# Patient Record
Sex: Male | Born: 1950 | Race: White | Hispanic: No | Marital: Married
Health system: Southern US, Community
[De-identification: ages and names within clinical notes are randomized; demographics above are authoritative.]

## PROBLEM LIST (undated history)

## (undated) DIAGNOSIS — M1990 Unspecified osteoarthritis, unspecified site: Secondary | ICD-10-CM

## (undated) DIAGNOSIS — I639 Cerebral infarction, unspecified: Secondary | ICD-10-CM

## (undated) DIAGNOSIS — F0390 Unspecified dementia without behavioral disturbance: Secondary | ICD-10-CM

## (undated) DIAGNOSIS — Z7401 Bed confinement status: Secondary | ICD-10-CM

## (undated) HISTORY — PX: TOTAL HIP ARTHROPLASTY: SHX124

---

## 2008-05-03 ENCOUNTER — Encounter: Admission: RE | Admit: 2008-05-03 | Discharge: 2008-05-03 | Payer: Self-pay | Admitting: Family Medicine

## 2009-02-07 ENCOUNTER — Encounter: Admission: RE | Admit: 2009-02-07 | Discharge: 2009-02-07 | Payer: Self-pay | Admitting: Family Medicine

## 2009-04-11 ENCOUNTER — Ambulatory Visit (HOSPITAL_COMMUNITY): Admission: RE | Admit: 2009-04-11 | Discharge: 2009-04-11 | Payer: Self-pay | Admitting: Family Medicine

## 2010-03-24 ENCOUNTER — Inpatient Hospital Stay (HOSPITAL_COMMUNITY): Admission: RE | Admit: 2010-03-24 | Discharge: 2010-03-27 | Payer: Self-pay | Admitting: Orthopedic Surgery

## 2011-01-05 LAB — URINALYSIS, ROUTINE W REFLEX MICROSCOPIC
Ketones, ur: 15 mg/dL — AB
pH: 6 (ref 5.0–8.0)

## 2011-01-05 LAB — DIFFERENTIAL
Basophils Absolute: 0.1 10*3/uL (ref 0.0–0.1)
Eosinophils Relative: 2 % (ref 0–5)
Lymphs Abs: 2.9 10*3/uL (ref 0.7–4.0)
Neutro Abs: 4.6 10*3/uL (ref 1.7–7.7)
Neutrophils Relative %: 54 % (ref 43–77)

## 2011-01-05 LAB — ABO/RH: ABO/RH(D): O POS

## 2011-01-05 LAB — CBC
HCT: 29.8 % — ABNORMAL LOW (ref 39.0–52.0)
Hemoglobin: 15.6 g/dL (ref 13.0–17.0)
MCHC: 33.8 g/dL (ref 30.0–36.0)
MCHC: 33.8 g/dL (ref 30.0–36.0)
MCHC: 34.2 g/dL (ref 30.0–36.0)
MCV: 90 fL (ref 78.0–100.0)
MCV: 90.1 fL (ref 78.0–100.0)
Platelets: 164 10*3/uL (ref 150–400)
Platelets: 185 10*3/uL (ref 150–400)
Platelets: 219 10*3/uL (ref 150–400)
RBC: 3.78 MIL/uL — ABNORMAL LOW (ref 4.22–5.81)
RBC: 5.11 MIL/uL (ref 4.22–5.81)
RDW: 13.9 % (ref 11.5–15.5)
RDW: 14.1 % (ref 11.5–15.5)
RDW: 14.3 % (ref 11.5–15.5)

## 2011-01-05 LAB — BASIC METABOLIC PANEL
BUN: 20 mg/dL (ref 6–23)
CO2: 27 mEq/L (ref 19–32)
CO2: 27 mEq/L (ref 19–32)
Creatinine, Ser: 1.5 mg/dL (ref 0.4–1.5)
Creatinine, Ser: 1.85 mg/dL — ABNORMAL HIGH (ref 0.4–1.5)
GFR calc non Af Amer: 38 mL/min — ABNORMAL LOW (ref >60)
Potassium: 3.7 mEq/L (ref 3.5–5.1)
Potassium: 3.4 mEq/L — ABNORMAL LOW (ref 3.5–5.1)
Sodium: 135 mEq/L (ref 135–145)

## 2011-01-05 LAB — TYPE AND SCREEN: ABO/RH(D): O POS

## 2011-01-05 LAB — PROTIME-INR
INR: 1.31 (ref 0.00–1.49)
INR: 1.93 — ABNORMAL HIGH (ref 0.00–1.49)
Prothrombin Time: 16.2 seconds — ABNORMAL HIGH (ref 11.6–15.2)
Prothrombin Time: 21.9 seconds — ABNORMAL HIGH (ref 11.6–15.2)

## 2011-01-05 LAB — GLUCOSE, CAPILLARY: Glucose-Capillary: 147 mg/dL — ABNORMAL HIGH (ref 70–99)

## 2011-01-05 LAB — APTT: aPTT: 27 seconds (ref 24–37)

## 2012-01-08 IMAGING — CR DG PORTABLE PELVIS
1 series · 1 of 1 positions shown · non-contrast
Comparison: 04/11/2009.

CLINICAL DATA: Osteoarthritis.  Status post right hip replacement.
Morbid obesity

PORTABLE PELVIS

[AP]
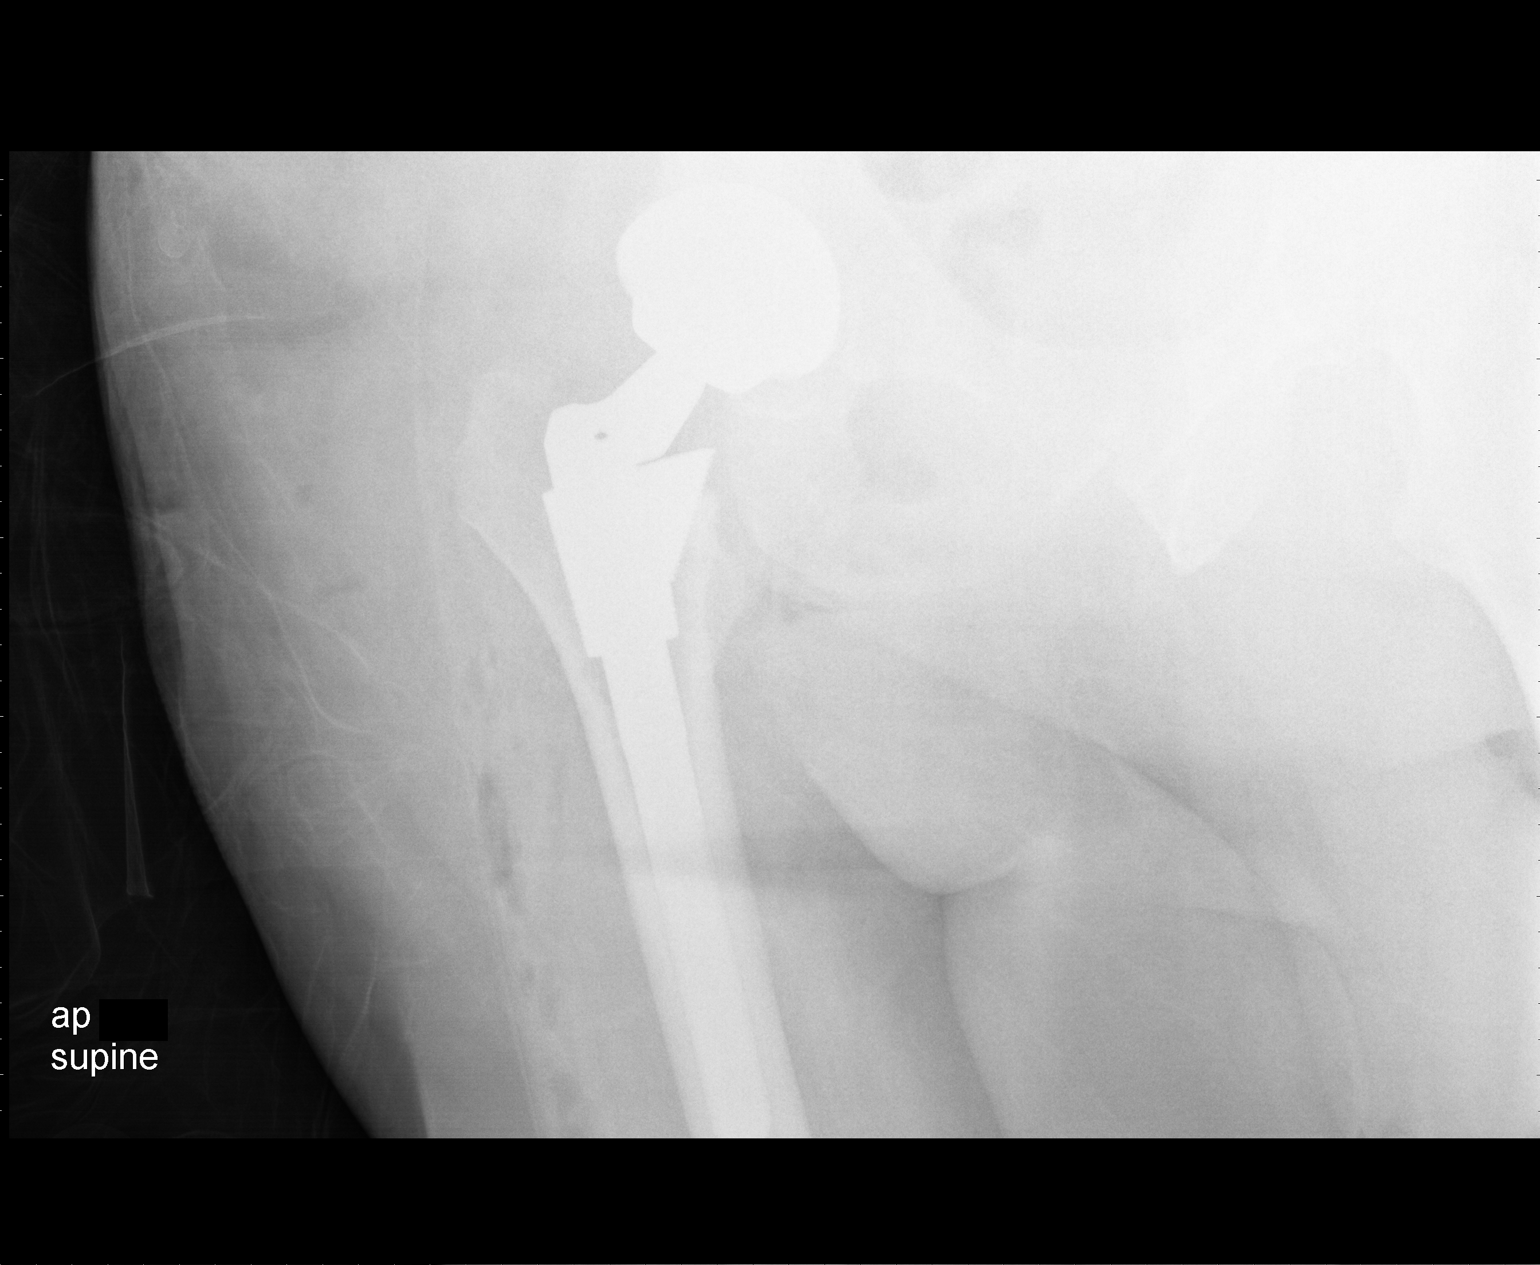

[1 of 1 positions shown; findings below may reference images not displayed]

FINDINGS: A right total hip prosthesis has been placed.  The
femoral and acetabular components appear in satisfactory position
and there are no periprosthetic fractures/abnormalities.
IMPRESSION: Satisfactory appearance following right total hip replacement.

## 2016-01-14 DIAGNOSIS — E669 Obesity, unspecified: Secondary | ICD-10-CM | POA: Diagnosis not present

## 2016-01-14 DIAGNOSIS — Z139 Encounter for screening, unspecified: Secondary | ICD-10-CM | POA: Diagnosis not present

## 2016-01-14 DIAGNOSIS — M545 Low back pain: Secondary | ICD-10-CM | POA: Diagnosis not present

## 2016-01-14 DIAGNOSIS — G8929 Other chronic pain: Secondary | ICD-10-CM | POA: Diagnosis not present

## 2016-01-14 DIAGNOSIS — I10 Essential (primary) hypertension: Secondary | ICD-10-CM | POA: Diagnosis not present

## 2016-01-14 DIAGNOSIS — R5383 Other fatigue: Secondary | ICD-10-CM | POA: Diagnosis not present

## 2016-01-24 DIAGNOSIS — M1711 Unilateral primary osteoarthritis, right knee: Secondary | ICD-10-CM | POA: Diagnosis not present

## 2016-01-24 DIAGNOSIS — M1712 Unilateral primary osteoarthritis, left knee: Secondary | ICD-10-CM | POA: Diagnosis not present

## 2016-02-27 DIAGNOSIS — M170 Bilateral primary osteoarthritis of knee: Secondary | ICD-10-CM | POA: Diagnosis not present

## 2016-03-05 DIAGNOSIS — M170 Bilateral primary osteoarthritis of knee: Secondary | ICD-10-CM | POA: Diagnosis not present

## 2016-03-12 DIAGNOSIS — M170 Bilateral primary osteoarthritis of knee: Secondary | ICD-10-CM | POA: Diagnosis not present

## 2016-03-19 DIAGNOSIS — M170 Bilateral primary osteoarthritis of knee: Secondary | ICD-10-CM | POA: Diagnosis not present

## 2016-03-26 DIAGNOSIS — M170 Bilateral primary osteoarthritis of knee: Secondary | ICD-10-CM | POA: Diagnosis not present

## 2016-05-12 DIAGNOSIS — M1712 Unilateral primary osteoarthritis, left knee: Secondary | ICD-10-CM | POA: Diagnosis not present

## 2016-05-12 DIAGNOSIS — M1711 Unilateral primary osteoarthritis, right knee: Secondary | ICD-10-CM | POA: Diagnosis not present

## 2016-06-30 DIAGNOSIS — M25561 Pain in right knee: Secondary | ICD-10-CM | POA: Diagnosis not present

## 2016-06-30 DIAGNOSIS — M25562 Pain in left knee: Secondary | ICD-10-CM | POA: Diagnosis not present

## 2016-09-23 DIAGNOSIS — M1711 Unilateral primary osteoarthritis, right knee: Secondary | ICD-10-CM | POA: Diagnosis not present

## 2016-09-23 DIAGNOSIS — M1712 Unilateral primary osteoarthritis, left knee: Secondary | ICD-10-CM | POA: Diagnosis not present

## 2016-11-12 DIAGNOSIS — M1711 Unilateral primary osteoarthritis, right knee: Secondary | ICD-10-CM | POA: Diagnosis not present

## 2016-11-12 DIAGNOSIS — M1712 Unilateral primary osteoarthritis, left knee: Secondary | ICD-10-CM | POA: Diagnosis not present

## 2022-03-14 ENCOUNTER — Inpatient Hospital Stay
Admission: EM | Admit: 2022-03-14 | Discharge: 2022-03-22 | DRG: 310 | Disposition: A | Discharge status: Hospice | Payer: Medicare Other | Attending: Internal Medicine | Admitting: Internal Medicine

## 2022-03-14 ENCOUNTER — Emergency Department: Payer: Medicare Other

## 2022-03-14 ENCOUNTER — Other Ambulatory Visit: Payer: Self-pay

## 2022-03-14 DIAGNOSIS — E669 Obesity, unspecified: Secondary | ICD-10-CM | POA: Diagnosis present

## 2022-03-14 DIAGNOSIS — Z6833 Body mass index (BMI) 33.0-33.9, adult: Secondary | ICD-10-CM

## 2022-03-14 DIAGNOSIS — F0390 Unspecified dementia without behavioral disturbance: Secondary | ICD-10-CM | POA: Diagnosis present

## 2022-03-14 DIAGNOSIS — Z7401 Bed confinement status: Secondary | ICD-10-CM | POA: Diagnosis not present

## 2022-03-14 DIAGNOSIS — Z66 Do not resuscitate: Secondary | ICD-10-CM | POA: Diagnosis present

## 2022-03-14 DIAGNOSIS — L89152 Pressure ulcer of sacral region, stage 2: Secondary | ICD-10-CM | POA: Diagnosis present

## 2022-03-14 DIAGNOSIS — R001 Bradycardia, unspecified: Secondary | ICD-10-CM | POA: Diagnosis present

## 2022-03-14 DIAGNOSIS — M170 Bilateral primary osteoarthritis of knee: Secondary | ICD-10-CM | POA: Diagnosis present

## 2022-03-14 DIAGNOSIS — L8992 Pressure ulcer of unspecified site, stage 2: Principal | ICD-10-CM

## 2022-03-14 DIAGNOSIS — R066 Hiccough: Secondary | ICD-10-CM | POA: Diagnosis present

## 2022-03-14 DIAGNOSIS — Z515 Encounter for palliative care: Secondary | ICD-10-CM | POA: Diagnosis not present

## 2022-03-14 DIAGNOSIS — Z8673 Personal history of transient ischemic attack (TIA), and cerebral infarction without residual deficits: Secondary | ICD-10-CM

## 2022-03-14 DIAGNOSIS — I442 Atrioventricular block, complete: Principal | ICD-10-CM | POA: Diagnosis present

## 2022-03-14 DIAGNOSIS — I441 Atrioventricular block, second degree: Secondary | ICD-10-CM

## 2022-03-14 DIAGNOSIS — L89151 Pressure ulcer of sacral region, stage 1: Secondary | ICD-10-CM | POA: Diagnosis present

## 2022-03-14 DIAGNOSIS — R600 Localized edema: Secondary | ICD-10-CM | POA: Diagnosis present

## 2022-03-14 HISTORY — DX: Unspecified dementia, unspecified severity, without behavioral disturbance, psychotic disturbance, mood disturbance, and anxiety: F03.90

## 2022-03-14 HISTORY — DX: Bed confinement status: Z74.01

## 2022-03-14 HISTORY — DX: Unspecified osteoarthritis, unspecified site: M19.90

## 2022-03-14 HISTORY — DX: Cerebral infarction, unspecified: I63.9

## 2022-03-14 LAB — CBC WITH DIFFERENTIAL/PLATELET
Abs Immature Granulocytes: 0.04 10*3/uL (ref 0.00–0.07)
Basophils Absolute: 0.1 10*3/uL (ref 0.0–0.1)
Basophils Relative: 1 %
Eosinophils Absolute: 0.2 10*3/uL (ref 0.0–0.5)
Eosinophils Relative: 2 %
HCT: 43.3 % (ref 39.0–52.0)
Hemoglobin: 14 g/dL (ref 13.0–17.0)
Immature Granulocytes: 0 %
Lymphocytes Relative: 21 %
Lymphs Abs: 1.9 10*3/uL (ref 0.7–4.0)
MCH: 30.7 pg (ref 26.0–34.0)
MCHC: 32.3 g/dL (ref 30.0–36.0)
MCV: 95 fL (ref 80.0–100.0)
Monocytes Absolute: 0.7 10*3/uL (ref 0.1–1.0)
Monocytes Relative: 8 %
Neutro Abs: 6.1 10*3/uL (ref 1.7–7.7)
Neutrophils Relative %: 68 %
Platelets: 157 10*3/uL (ref 150–400)
RBC: 4.56 MIL/uL (ref 4.22–5.81)
RDW: 13.4 % (ref 11.5–15.5)
WBC: 9 10*3/uL (ref 4.0–10.5)
nRBC: 0 % (ref 0.0–0.2)

## 2022-03-14 LAB — COMPREHENSIVE METABOLIC PANEL
ALT: 8 U/L (ref 0–44)
AST: 18 U/L (ref 15–41)
Albumin: 2.7 g/dL — ABNORMAL LOW (ref 3.5–5.0)
Alkaline Phosphatase: 56 U/L (ref 38–126)
Anion gap: 7 (ref 5–15)
BUN: 13 mg/dL (ref 8–23)
CO2: 23 mmol/L (ref 22–32)
Calcium: 7.8 mg/dL — ABNORMAL LOW (ref 8.9–10.3)
Chloride: 109 mmol/L (ref 98–111)
Creatinine, Ser: 0.68 mg/dL (ref 0.61–1.24)
GFR, Estimated: >60 mL/min (ref >60)
Glucose, Bld: 73 mg/dL (ref 70–99)
Potassium: 3.7 mmol/L (ref 3.5–5.1)
Sodium: 139 mmol/L (ref 135–145)
Total Bilirubin: 1.5 mg/dL — ABNORMAL HIGH (ref 0.3–1.2)
Total Protein: 5.6 g/dL — ABNORMAL LOW (ref 6.5–8.1)

## 2022-03-14 LAB — BRAIN NATRIURETIC PEPTIDE: B Natriuretic Peptide: 42.3 pg/mL (ref 0.0–100.0)

## 2022-03-14 LAB — TROPONIN I (HIGH SENSITIVITY)
Troponin I (High Sensitivity): 12 ng/L (ref <18)
Troponin I (High Sensitivity): 18 ng/L — ABNORMAL HIGH (ref <18)

## 2022-03-14 LAB — LACTIC ACID, PLASMA: Lactic Acid, Venous: 2.1 mmol/L (ref 0.5–1.9)

## 2022-03-14 MED ORDER — LORAZEPAM 2 MG/ML IJ SOLN: 2.0000 mg | INTRAMUSCULAR | Status: DC | PRN

## 2022-03-14 MED ORDER — ONDANSETRON HCL 4 MG PO TABS: 4.0000 mg | ORAL_TABLET | Freq: Four times a day (QID) | ORAL | Status: DC | PRN

## 2022-03-14 MED ORDER — ACETAMINOPHEN 325 MG PO TABS: 650.0000 mg | ORAL_TABLET | Freq: Four times a day (QID) | ORAL | Status: DC | PRN

## 2022-03-14 MED ORDER — ONDANSETRON HCL 4 MG/2ML IJ SOLN: 4.0000 mg | Freq: Four times a day (QID) | INTRAMUSCULAR | Status: DC | PRN

## 2022-03-14 MED ORDER — SENNOSIDES-DOCUSATE SODIUM 8.6-50 MG PO TABS: 1.0000 | ORAL_TABLET | Freq: Every evening | ORAL | Status: DC | PRN

## 2022-03-14 MED ORDER — MORPHINE SULFATE (PF) 2 MG/ML IV SOLN: 2.0000 mg | INTRAVENOUS | Status: DC | PRN

## 2022-03-14 MED ORDER — ACETAMINOPHEN 650 MG RE SUPP: 650.0000 mg | Freq: Four times a day (QID) | RECTAL | Status: DC | PRN

## 2022-03-14 NOTE — H&P (Signed)
History and Physical:    Charles HoehnLarry D Dusza   JWJ:[id number] DOB: 10/05/1951 DOA: 03/14/2022  Referring MD/provider: Dorothea GlassmanPaul Malinda, MD PCP: Pcp, No   Patient coming from: Home  Chief Complaint: Wound check, bradycardia  History of Present Illness:   Charles Mcfarland is a 71 y.o. male with medical history significant for arthritis of the knees (declined surgery), bedridden status for over 2 years, dementia over 5 years that has slowly progressed over the past 2 years, history of remote hip replacement about 10 years ago, history of stroke about a year ago.  EMS was called to bring the patient to the hospital for evaluation of wound on his lower back.  However, EMS noted that patient was bradycardic with heart rate in the 30s so he was given a bolus of IV fluid, 500 cc, and atropine.  However there is no change in his heart rate.  He was brought to the hospital for further management.  ED Course:  The patient was hypotensive and bradycardic with heart rate ranging between the 20s and 40s.  EKG showed complete heart block.   ROS:   ROS unable to obtain review of systems because of confusion  Past Medical History:   Past Medical History:  Diagnosis Date   Arthritis    Bedridden    Dementia (HCC)    Stroke [facility](HCC)     Past Surgical History:   History reviewed. No pertinent surgical history.  Social History:   Social History   Socioeconomic History   Marital status: Married    Spouse name: Not on file   Number of children: Not on file   Years of education: Not on file   Highest education level: Not on file  Occupational History   Not on file  Tobacco Use   Smoking status: Unknown   Smokeless tobacco: Not on file  Substance and Sexual Activity   Alcohol use: Not on file   Drug use: Not on file   Sexual activity: Not Currently  Other Topics Concern   Not on file  Social History Narrative   Not on file   Social Determinants of Health   Financial Resource Strain: Not on  file  Food Insecurity: Not on file  Transportation Needs: Not on file  Physical Activity: Not on file  Stress: Not on file  Social Connections: Not on file  Intimate Partner Violence: Not on file    Allergies   Patient has no known allergies.  Family history:   No family history on file.  Current Medications:   Prior to Admission medications   Not on File    Physical Exam:   Vitals:   03/14/22 1450 03/14/22 1456 03/14/22 1457  BP:  (!) 164/83   Pulse:  (!) 30   Resp:  15   Temp:  98.4 F (36.9 C)   TempSrc:  Oral   SpO2: 96% 100%   Weight:   113.4 kg  Height:   6' (1.829 m)     Physical Exam: Blood pressure (!) 164/83, pulse (!) 30, temperature 98.4 F (36.9 C), temperature source Oral, resp. rate 15, height 6' (1.829 m), weight 113.4 kg, SpO2 100 %. Gen: No acute distress. Head: Normocephalic, atraumatic. Eyes: Pupils equal, round and reactive to light. Extraocular movements intact.  Sclerae nonicteric.  Mouth: Dry mucous membranes Neck: Supple, no thyromegaly, no lymphadenopathy, no jugular venous distention. Chest: Lungs are clear to auscultation with good air movement. No rales, rhonchi or wheezes.  CV:  Bradycardic.  Heart sounds are regular with an S1, S2. No murmurs, rubs or gallops.  Abdomen: Soft, nontender, nondistended with normal active bowel sounds. No palpable masses. Extremities: Extremities are without clubbing, or cyanosis.  Bilateral leg edema.  Pedal pulses 2+.  Skin: Warm and dry.  Stage II coccygeal decubitus ulcer.  Erythematous rash noted in bilateral axilla and around the neck. Neuro: Alert and oriented to person only.  He is unable to follow commands making neurological exam difficult.  He appears to have weakness of the right upper extremity. Psych: Poor insight and judgment.  Mood is depressed.   Data Review:    Labs: Basic Metabolic Panel: Recent Labs  Lab 03/14/22 1458  NA 139  K 3.7  CL 109  CO2 23  GLUCOSE 73  BUN 13   CREATININE 0.68  CALCIUM 7.8*   Liver Function Tests: Recent Labs  Lab 03/14/22 1458  AST 18  ALT 8  ALKPHOS 56  BILITOT 1.5*  PROT 5.6*  ALBUMIN 2.7*   No results for input(s): LIPASE, AMYLASE in the last 168 hours. No results for input(s): AMMONIA in the last 168 hours. CBC: Recent Labs  Lab 03/14/22 1458  WBC 9.0  NEUTROABS 6.1  HGB 14.0  HCT 43.3  MCV 95.0  PLT 157   Cardiac Enzymes: No results for input(s): CKTOTAL, CKMB, CKMBINDEX, TROPONINI in the last 168 hours.  BNP (last 3 results) No results for input(s): PROBNP in the last 8760 hours. CBG: No results for input(s): GLUCAP in the last 168 hours.  Urinalysis    Component Value Date/Time   COLORURINE YELLOW 03/18/2010 1322   APPEARANCEUR CLEAR 03/18/2010 1322   LABSPEC 1.021 03/18/2010 1322   PHURINE 6.0 03/18/2010 1322   GLUCOSEU NEGATIVE 03/18/2010 1322   HGBUR NEGATIVE 03/18/2010 1322   BILIRUBINUR NEGATIVE 03/18/2010 1322   KETONESUR 15 (A) 03/18/2010 1322   PROTEINUR NEGATIVE 03/18/2010 1322   UROBILINOGEN 0.2 03/18/2010 1322   NITRITE NEGATIVE 03/18/2010 1322   LEUKOCYTESUR  03/18/2010 1322    NEGATIVE MICROSCOPIC NOT DONE ON URINES WITH NEGATIVE PROTEIN, BLOOD, LEUKOCYTES, NITRITE, OR GLUCOSE <1000 mg/dL.      Radiographic Studies: DG Chest Portable 1 View  Result Date: 03/14/2022 CLINICAL DATA:  EMS called for wound check initially. EXAM: PORTABLE CHEST 1 VIEW COMPARISON:  Chest two views 03/18/2010 FINDINGS: Cardiac silhouette is at the upper limits of normal size for AP technique. Mediastinal contours are within normal limits. Mildly decreased lung volumes with bibasilar bronchovascular crowding. No focal airspace opacity to indicate pneumonia. No definite pleural effusion. No pneumothorax is seen. Moderate dextrocurvature of the mid to lower thoracic spine. IMPRESSION: Decreased lung volumes without acute lung process seen. Electronically Signed   By: Neita Garnet M.D.   On: 03/14/2022  15:17    EKG: Independently reviewed by me shows complete heart block.    Assessment/Plan:   Principal Problem:   Complete heart block (HCC) Active Problems:   Hospice care   Dementia without behavioral disturbance (HCC)   History of stroke    Body mass index is 33.91 kg/m.  (Obesity)  Complete heart block Progressively worsening dementia History of stroke Lower extremity edema Bedridden status Stage II coccygeal decubitus ulcer  PLAN  His wife has been hospitalized at [facility].  Plan of care was discussed with his wife over the phone.  She said that patient has been bedridden for about 2 years.  His dementia has progressively gotten worse in the past 2 years.  He has a poor quality of life.  He is a DNR.  She does not want patient to have any pacemaker or any aggressive measures.  Diagnoses and prognosis were discussed and his wife opted for comfort measures.  Patient will be placed on comfort measures and hospice team will be consulted to assist with placement to hospice house. Use IV morphine as needed for pain and IV Ativan as needed for anxiety. Cardiology consult has been canceled.  Plan of care was discussed with Dr. Darnelle Catalan, ED physician.    Other information:   DVT prophylaxis: None  Code Status: DNR. Family Communication: Plan discussed with his wife over the phone Disposition Plan: Plan to discharge to hospice house Consults called: None Admission status: Inpatient  The medical decision making on this patient was of high complexity and the patient is at high risk for clinical deterioration, therefore this is a level 3 visit.     Rosealynn Mateus [facility] Pager: Please check [email or web address]   How to contact the [facility] Attending or Consulting provider 7A - 7P or covering provider during after hours 7P -7A, for this patient?   Check the care team in [facility] and look for a) attending/consulting TRH provider listed and b) the [facility] team  listed Log into [email or web address] and use [place]'s universal password to access. If you do not have the password, please contact the hospital operator. Locate the [facility] provider you are looking for under [facility] and page to a number that you can be directly reached. If you still have difficulty reaching the provider, please page the [facility] (Director on Call) for the Hospitalists listed on amion for assistance.  03/14/2022, 5:08 PM

## 2022-03-14 NOTE — ED Triage Notes (Signed)
Ems called out for wound check initially. Pt has no symptoms but did have a heart rate of 30 on scene. EMS gave 500 fluid and atropine that did not change pt HR. Family told EMS that pt has no hx but pt is bed bound and not oriented with facial droop and no use of right arm. Pt son states all this is baseline for pt but denies medical hx

## 2022-03-14 NOTE — ED Notes (Addendum)
Attempted BP multiple times.

## 2022-03-14 NOTE — ED Notes (Signed)
Pt had multiple layers of soiled pads under him and between legs. Pt wiped with bath wipes and clean pads placed under pt. Pt placed in clean gown and purewick applied

## 2022-03-14 NOTE — ED Provider Notes (Signed)
[facility] Provider Note    None    (approximate)   History   Bradycardia   HPI  Charles Mcfarland is a 71 y.o. male who comes from home.  Patient's wife is usual caregiver per EMS but she was in [facility].  Son has come to give the patient his care.  According to son patient has no known medical conditions and takes no medicines.  Patient also says he has no known medical conditions and takes no medicines.  Patient is only oriented to person.  Patient does not move his right arm.  He has difficulty sitting up.  He has a little bit of a facial droop.  Patient was noted to have decubitus over the sacrum by the son who wanted him checked out and that is why EMS was called.  Patient was noted by EMS to be in second-degree heart block unsure whether type I or II as he is missing every other beat.  He is bradycardic at a rate of 31.     Physical Exam   Triage Vital Signs: ED Triage Vitals [03/14/22 1450]  Enc Vitals Group     BP      Pulse      Resp      Temp      Temp src      SpO2 96 %     Weight      Height      Head Circumference      Peak Flow      Pain Score      Pain Loc      Pain Edu?      Excl. in GC?     Most recent vital signs: Vitals:   03/14/22 1450 03/14/22 1456  BP:  (!) 164/83  Pulse:  (!) 30  Resp:  15  Temp:  98.4 F (36.9 C)  SpO2: 96% 100%     General: Awake, no distress.  CV:  Good peripheral perfusion.  Heart regular rate and rhythm but bradycardia Resp:  Normal effort.  Lungs clear Abd:  No distention.  Soft and nontender Patient with a lot of crusting of powder under his right arm and on the left side of the neck as though he is not moving those well.  He smells like he has not had a good bed bath in quite some time. Skin: Patient has a small area of skin breakdown not through the subcu fat not to the subcu fat just at the top of the buttocks crack.  It is about a centimeter in diameter.  There may be some  first-degree decubiti developing above it more toward the head.  ED Results / Procedures / Treatments   Labs (all labs ordered are listed, but only abnormal results are displayed) Labs Reviewed  COMPREHENSIVE METABOLIC PANEL - Abnormal; Notable for the following components:      Result Value   Calcium 7.8 (*)    Total Protein 5.6 (*)    Albumin 2.7 (*)    Total Bilirubin 1.5 (*)    All other components within normal limits  CBC WITH DIFFERENTIAL/PLATELET  LACTIC ACID, PLASMA  LACTIC ACID, PLASMA  TROPONIN I (HIGH SENSITIVITY)     EKG  EKG read interpreted by me shows bradycardia the EMS EKG showed a rate of 30.  It is in some type of second-degree block with every other beat drop.  Normal axis no acute ST-T changes   RADIOLOGY  Chest x-ray read by radiology films reviewed and interpreted by me do show low lung volumes as radiologist said.  No obvious acute pathology.  PROCEDURES:  Critical Care performed:   Procedures   MEDICATIONS ORDERED IN ED: Medications - No data to display   IMPRESSION / MDM / ASSESSMENT AND PLAN / ED COURSE  I reviewed the triage vital signs and the nursing notes.  ----------------------------------------- 3:22 PM on 03/14/2022 -----------------------------------------  I spoke with patient's wife Charles Mcfarland who is in patient at [facility].  She had been his caregiver up until just recently when she needed emergency surgery.  She reports that he has had a stroke and has dementia.  He does not take any medicines.  He is bedbound mostly because he needed a knee replacement and did not get it.  He did not want to get it.  He is lost 50 to 60 pounds in the last few months.  She reports he was acting like he had gotten sick about a week ago and seem to get somewhat better.  She does not want a pacemaker.  She does not want to have been resuscitated if he should die.  She would like to try to get him placed.    Patient's presentation is most  consistent with severe exacerbation of chronic illness.  The patient is on the cardiac monitor to evaluate for evidence of arrhythmia and/or significant heart rate changes.  Second-degree heart block of unknown type since he is missing every other beat.      FINAL CLINICAL IMPRESSION(S) / ED DIAGNOSES   Final diagnoses:  Pressure injury, stage 2, unspecified location (HCC)  Second degree heart block     Rx / DC Orders   ED Discharge Orders     None        Note:  This document was prepared using Dragon voice recognition software and may include unintentional dictation errors.   Arnaldo Natal, MD 03/14/22 [phone]

## 2022-03-15 ENCOUNTER — Other Ambulatory Visit: Payer: Self-pay

## 2022-03-15 DIAGNOSIS — L89151 Pressure ulcer of sacral region, stage 1: Secondary | ICD-10-CM | POA: Diagnosis present

## 2022-03-15 LAB — TSH: TSH: 1.231 u[IU]/mL (ref 0.350–4.500)

## 2022-03-15 NOTE — TOC Initial Note (Addendum)
Transition of Care [facility]) - Initial/Assessment Note    Patient Details  Name: Charles Mcfarland MRN: [id number] Date of Birth: 1951/02/07  Transition of Care [facility]) CM/SW Contact:    Magnus Ivan, LCSW Phone Number: 03/15/2022, 10:58 AM  Clinical Narrative:                CSW notified that patient needs referral to [facility]. Lorayne Bender with [facility] made aware. She stated they have no beds today.   3:57- Per Lorayne Bender with [facility], patient's family has chosen home with hospice. Not ready for DC home with hospice today. MD aware in secure chat.   Expected Discharge Plan: [place] Barriers to Discharge: Hospice Bed not available   Patient Goals and CMS Choice Patient states their goals for this hospitalization and ongoing recovery are:: hospice home      Expected Discharge Plan and Services Expected Discharge Plan: [place]                                              Prior Living Arrangements/Services                       Activities of Daily Living Home Assistive Devices/Equipment: None ADL Screening (condition at time of admission) Patient's cognitive ability adequate to safely complete daily activities?: No Is the patient deaf or have difficulty hearing?: Yes Does the patient have difficulty seeing, even when wearing glasses/contacts?: No Does the patient have difficulty concentrating, remembering, or making decisions?: Yes Patient able to express need for assistance with ADLs?: No Does the patient have difficulty dressing or bathing?: Yes Independently performs ADLs?: No Communication: Independent Dressing (OT): Dependent Is this a change from baseline?: Pre-admission baseline Grooming: Dependent Is this a change from baseline?: Pre-admission baseline Feeding: Dependent Is this a change from baseline?: Pre-admission baseline Bathing: Dependent Is this a change from baseline?: Pre-admission  baseline Toileting: Dependent Is this a change from baseline?: Pre-admission baseline In/Out Bed: Dependent Is this a change from baseline?: Pre-admission baseline Walks in Home: Dependent Is this a change from baseline?: Pre-admission baseline Does the patient have difficulty walking or climbing stairs?: Yes Weakness of Legs: Both Weakness of Arms/Hands: Right  Permission Sought/Granted                  Emotional Assessment              Admission diagnosis:  Complete heart block ([place]) [I44.2] Second degree heart block [I44.1] Pressure injury, stage 2, unspecified location [facility]) [L89.92] Patient Active Problem List   Diagnosis Date Noted   Pressure injury of skin 03/15/2022   Complete heart block ([place]) 03/14/2022   Hospice care 03/14/2022   Dementia without behavioral disturbance ([place]) 03/14/2022   History of stroke 03/14/2022   PCP:  Pcp, No Pharmacy:  No Pharmacies Listed    Social Determinants of Health (SDOH) Interventions    Readmission Risk Interventions     View : No data to display.

## 2022-03-15 NOTE — Progress Notes (Signed)
Manufacturing engineer [facility]) Hospital Liaison Note   Received request from [employer] [employer] [place] for family interest in [facility]. Visited patient at bedside and spoke with wife/Cheryl to confirm interest and explain services.   Approval for Hospice Home is determined by [facility] MD. Once [facility] MD has determined Hospice Home eligibility, [place] will update hospital staff and family. Patient has been approved.    Unfortunately, Hospice Home is not able to offer a room today. Family and [facility] Manager aware hospital liaison will follow up tomorrow or sooner if a room becomes available.    Please do not hesitate to call with any hospice related questions.    Thank you for the opportunity to participate in this patient's care.   Daphene Calamity, MSW [facility] Liaison [phone]

## 2022-03-15 NOTE — Progress Notes (Addendum)
Progress Note    Charles Mcfarland  [id number] DOB: 1951/01/12  DOA: 03/14/2022 PCP: Pcp, No      Brief Narrative:    Medical records reviewed and are as summarized below:  Charles Mcfarland is a 71 y.o. male with medical history significant for arthritis of the knees (declined surgery), bedridden status for over 2 years, dementia over 5 years that has slowly progressed over the past 2 years, history of remote hip replacement about 10 years ago, history of stroke about a year ago.  EMS was called to bring the Charles Mcfarland to the hospital for evaluation of wound on his lower back.  However, EMS noted that Charles Mcfarland was bradycardic with heart rate in the 30s.   He was found to have complete heart block.  However, his wife declined pacemaker placement and any aggressive treatment.  He said Charles Mcfarland has poor quality of life and requested Charles Mcfarland be placed on comfort measures.  He was evaluated by the hospice team was said he is eligible for comfort care at the hospice house.       Assessment/Plan:   Principal Problem:   Complete heart block (HCC) Active Problems:   Hospice care   Dementia without behavioral disturbance (HCC)   History of stroke   Decubitus ulcer of sacral region, stage 1   Body mass index is 33.91 kg/m.  (Obesity)   Complete heart block Progressively worsening dementia History of stroke Lower extremity edema Bedridden status Stage I sacral decubitus ulcer   PLAN  Continue IV morphine as needed for pain. Continue IV Ativan as needed for anxiety. Continue comfort measures. Consulted hospice team and Charles Mcfarland is deemed eligible for comfort measures at the hospice house.  Awaiting placement.   Diet Order             Diet general           Diet regular Room service appropriate? Yes; Fluid consistency: Thin  Diet effective now                         Consultants: None  Procedures: None    Medications:    Continuous  Infusions:   Anti-infectives (From admission, onward)    None              Family Communication/Anticipated D/C date and plan/Code Status   DVT prophylaxis:      Code Status: DNR  Family Communication: Plan discussed with his wife over the phone Disposition Plan: Plan to discharge to hospice house today or tomorrow   Status is: Inpatient Remains inpatient appropriate because: Comfort care awaiting hospice house placement       Subjective:   Interval events noted.  He does not provide much history.  He is confused.  Objective:    Vitals:   03/14/22 1937 03/14/22 2125 03/14/22 2222 03/15/22 0859  BP: 130/78 (!) 182/54 (!) 203/114 138/61  Pulse: (!) 28 (!) 30 (!) 28 (!) 33  Resp: (!) 21 11 18 16   Temp: 98.2 F (36.8 C)  99 F (37.2 C) 97.8 F (36.6 C)  TempSrc: Oral  Oral   SpO2: 97% 100% 98% 97%  Weight:      Height:       No data found.   Intake/Output Summary (Last 24 hours) at 03/15/2022 1402 Last data filed at 03/15/2022 1100 Gross per 24 hour  Intake 800 ml  Output --  Net 800 ml   03/17/2022  Weights   03/14/22 1457  Weight: 113.4 kg    Exam:  GEN: NAD SKIN: Erythematous rash in bilateral axillae and around the neck EYES: EOMI ENT: MMM CV: RRR, bradycardic PULM: CTA B ABD: soft, obese, NT, +BS CNS: AAO x person only, non focal EXT: Bilateral leg edema, no tenderness     Pressure Injury 03/14/22 Sacrum Stage 1 -  Intact skin with non-blanchable redness of a localized area usually over a bony prominence. (Active)  03/14/22 2230  Location: Sacrum  Location Orientation:   Staging: Stage 1 -  Intact skin with non-blanchable redness of a localized area usually over a bony prominence.  Wound Description (Comments):   Present on Admission: Yes  Dressing Type Foam - Lift dressing to assess site every shift 03/15/22 0800     Data Reviewed:   I have personally reviewed following labs and imaging studies:  Labs: Labs show the  following:   Basic Metabolic Panel: Recent Labs  Lab 03/14/22 1458  NA 139  K 3.7  CL 109  CO2 23  GLUCOSE 73  BUN 13  CREATININE 0.68  CALCIUM 7.8*   GFR Estimated Creatinine Clearance: 113.3 mL/min (by C-G formula based on SCr of 0.68 mg/dL). Liver Function Tests: Recent Labs  Lab 03/14/22 1458  AST 18  ALT 8  ALKPHOS 56  BILITOT 1.5*  PROT 5.6*  ALBUMIN 2.7*   No results for input(s): LIPASE, AMYLASE in the last 168 hours. No results for input(s): AMMONIA in the last 168 hours. Coagulation profile No results for input(s): INR, PROTIME in the last 168 hours.  CBC: Recent Labs  Lab 03/14/22 1458  WBC 9.0  NEUTROABS 6.1  HGB 14.0  HCT 43.3  MCV 95.0  PLT 157   Cardiac Enzymes: No results for input(s): CKTOTAL, CKMB, CKMBINDEX, TROPONINI in the last 168 hours. BNP (last 3 results) No results for input(s): PROBNP in the last 8760 hours. CBG: No results for input(s): GLUCAP in the last 168 hours. D-Dimer: No results for input(s): DDIMER in the last 72 hours. Hgb A1c: No results for input(s): HGBA1C in the last 72 hours. Lipid Profile: No results for input(s): CHOL, HDL, LDLCALC, TRIG, CHOLHDL, LDLDIRECT in the last 72 hours. Thyroid function studies: Recent Labs    03/14/22 2319  TSH 1.231   Anemia work up: No results for input(s): VITAMINB12, FOLATE, FERRITIN, TIBC, IRON, RETICCTPCT in the last 72 hours. Sepsis Labs: Recent Labs  Lab 03/14/22 1458 03/14/22 2319  WBC 9.0  --   LATICACIDVEN  --  2.1*    Microbiology No results found for this or any previous visit (from the past 240 hour(s)).  Procedures and diagnostic studies:  DG Chest Portable 1 View  Result Date: 03/14/2022 CLINICAL DATA:  EMS called for wound check initially. EXAM: PORTABLE CHEST 1 VIEW COMPARISON:  Chest two views 03/18/2010 FINDINGS: Cardiac silhouette is at the upper limits of normal size for AP technique. Mediastinal contours are within normal limits. Mildly  decreased lung volumes with bibasilar bronchovascular crowding. No focal airspace opacity to indicate pneumonia. No definite pleural effusion. No pneumothorax is seen. Moderate dextrocurvature of the mid to lower thoracic spine. IMPRESSION: Decreased lung volumes without acute lung process seen. Electronically Signed   By: Neita Garnet M.D.   On: 03/14/2022 15:17               LOS: 1 day   Eliga Arvie  [facility]   Pager on [email or web address]. If 7PM-7AM, please contact night-coverage  at [email or web address]     03/15/2022, 2:02 PM

## 2022-03-15 NOTE — Discharge Summary (Addendum)
Physician Discharge Summary   Patient: Charles Mcfarland MRN: [id number] DOB: Nov 28, 1950  Admit date:     03/14/2022  Discharge date: 03/15/22  Discharge Physician: Jennye Boroughs   PCP: Pcp, No   Recommendations at discharge:   Follow up with hospice team within 24 hours of discharge.   Discharge Diagnoses: Principal Problem:   Complete heart block ([place]) Active Problems:   Hospice care   Dementia without behavioral disturbance (HCC)   History of stroke   Decubitus ulcer of sacral region, stage 1  Resolved Problems:   * No resolved hospital problems. [facility] Course: Mr. Charles Mcfarland is a 71 y.o. male with medical history significant for arthritis of the knees (declined surgery), bedridden status for over 2 years, dementia over 5 years that has slowly progressed over the past 2 years, history of remote hip replacement about 10 years ago, history of stroke about a year ago.  EMS was called to bring the patient to the hospital for evaluation of wound on his lower back.  However, EMS noted that patient was bradycardic with heart rate in the 30s.  He was found to have complete heart block.  However, his wife declined pacemaker placement and any aggressive treatment.  He said patient has poor quality of life and requested patient be placed on comfort measures.  He was evaluated by the hospice team was said he is eligible for comfort care at the hospice house.        Consultants: None Procedures performed:  None Disposition: Hospice care Diet recommendation:  Discharge Diet Orders (From admission, onward)     Start     Ordered   03/15/22 0000  Diet general        03/15/22 1324           Regular diet DISCHARGE MEDICATION: Allergies as of 03/15/2022   No Known Allergies      Medication List    You have not been prescribed any medications.     Discharge Exam: Filed Weights   03/14/22 1457  Weight: 113.4 kg   GEN: NAD SKIN: Erythematous rash in bilateral axillae  and around the neck EYES: EOMI ENT: MMM CV: RRR, bradycardic PULM: CTA B ABD: soft, obese, NT, +BS CNS: AAO x person only, non focal EXT: Bilateral leg edema, no tenderness   Condition at discharge: stable  The results of significant diagnostics from this hospitalization (including imaging, microbiology, ancillary and laboratory) are listed below for reference.   Imaging Studies: DG Chest Portable 1 View  Result Date: 03/14/2022 CLINICAL DATA:  EMS called for wound check initially. EXAM: PORTABLE CHEST 1 VIEW COMPARISON:  Chest two views 03/18/2010 FINDINGS: Cardiac silhouette is at the upper limits of normal size for AP technique. Mediastinal contours are within normal limits. Mildly decreased lung volumes with bibasilar bronchovascular crowding. No focal airspace opacity to indicate pneumonia. No definite pleural effusion. No pneumothorax is seen. Moderate dextrocurvature of the mid to lower thoracic spine. IMPRESSION: Decreased lung volumes without acute lung process seen. Electronically Signed   By: Yvonne Kendall M.D.   On: 03/14/2022 15:17    Microbiology: No results found for this or any previous visit.  Labs: CBC: Recent Labs  Lab 03/14/22 1458  WBC 9.0  NEUTROABS 6.1  HGB 14.0  HCT 43.3  MCV 95.0  PLT [id number]   Basic Metabolic Panel: Recent Labs  Lab 03/14/22 1458  NA 139  K 3.7  CL 109  CO2 23  GLUCOSE 73  BUN 13  CREATININE 0.68  CALCIUM 7.8*   Liver Function Tests: Recent Labs  Lab 03/14/22 1458  AST 18  ALT 8  ALKPHOS 56  BILITOT 1.5*  PROT 5.6*  ALBUMIN 2.7*   CBG: No results for input(s): GLUCAP in the last 168 hours.  Discharge time spent: greater than 30 minutes.  Signed: Jennye Boroughs, MD [facility] 03/15/2022

## 2022-03-16 NOTE — Progress Notes (Signed)
Progress Note    Charles Mcfarland  [id number] DOB: 10/12/1951  DOA: 03/14/2022 PCP: Pcp, No      Brief Narrative:    Medical records reviewed and are as summarized below:  Charles Mcfarland is a 71 y.o. male with medical history significant for arthritis of the knees (declined surgery), bedridden status for over 2 years, dementia over 5 years that has slowly progressed over the past 2 years, history of remote hip replacement about 10 years ago, history of stroke about a year ago.  EMS was called to bring the patient to the hospital for evaluation of wound on his lower back.  However, EMS noted that patient was bradycardic with heart rate in the 30s.   He was found to have complete heart block.  However, his wife declined pacemaker placement and any aggressive treatment.  He said patient has poor quality of life and requested patient be placed on comfort measures.  He was evaluated by the hospice team was said he is eligible for comfort care at the hospice house.       Assessment/Plan:   Principal Problem:   Complete heart block (HCC) Active Problems:   Hospice care   Dementia without behavioral disturbance (HCC)   History of stroke   Decubitus ulcer of sacral region, stage 1   Body mass index is 33.91 kg/m.  (Obesity)   Complete heart block Progressively worsening dementia History of stroke Lower extremity edema Bedridden status Stage I sacral decubitus ulcer   PLAN  Continue comfort measures His wife prefers that patient be discharged home with hospice.  However, his wife remains hospitalized and there is no safe discharge plan at this time.  Diet Order             Diet general           Diet regular Room service appropriate? Yes; Fluid consistency: Thin  Diet effective now                         Consultants: None  Procedures: None    Medications:    Continuous Infusions:   Anti-infectives (From admission, onward)    None               Family Communication/Anticipated D/C date and plan/Code Status   DVT prophylaxis:      Code Status: DNR  Family Communication: None Disposition Plan: Plan to discharge to hospice tomorrow   Status is: Inpatient Remains inpatient appropriate because: He is on comfort care, unsafe discharge since his wife is hospitalized.     Subjective:   He has no complaints.  He does not provide much history because of confusion.  Objective:    Vitals:   03/14/22 2125 03/14/22 2222 03/15/22 0859 03/16/22 0755  BP: (!) 182/54 (!) 203/114 138/61 (!) 176/66  Pulse: (!) 30 (!) 28 (!) 33 (!) 36  Resp: 11 18 16 15   Temp:  99 F (37.2 C) 97.8 F (36.6 C) 97.7 F (36.5 C)  TempSrc:  Oral    SpO2: 100% 98% 97% 96%  Weight:      Height:       No data found.   Intake/Output Summary (Last 24 hours) at 03/16/2022 1446 Last data filed at 03/15/2022 1900 Gross per 24 hour  Intake 20 ml  Output --  Net 20 ml   Filed Weights   03/14/22 1457  Weight: 113.4 kg    Exam:  GEN: NAD SKIN: Warm and dry.  Erythematous rash on bilateral axillae and right around the neck EYES: EOMI ENT: MMM CV: RRR PULM: CTA B ABD: soft, ND, NT, +BS CNS: AAO x 3, non focal EXT: Bilateral leg edema.  Edema of right upper extremity   Pressure Injury 03/14/22 Sacrum Stage 1 -  Intact skin with non-blanchable redness of a localized area usually over a bony prominence. (Active)  03/14/22 2230  Location: Sacrum  Location Orientation:   Staging: Stage 1 -  Intact skin with non-blanchable redness of a localized area usually over a bony prominence.  Wound Description (Comments):   Present on Admission: Yes  Dressing Type Foam - Lift dressing to assess site every shift 03/16/22 0730     Data Reviewed:   I have personally reviewed following labs and imaging studies:  Labs: Labs show the following:   Basic Metabolic Panel: Recent Labs  Lab 03/14/22 1458  NA 139  K 3.7  CL 109   CO2 23  GLUCOSE 73  BUN 13  CREATININE 0.68  CALCIUM 7.8*   GFR Estimated Creatinine Clearance: 113.3 mL/min (by C-G formula based on SCr of 0.68 mg/dL). Liver Function Tests: Recent Labs  Lab 03/14/22 1458  AST 18  ALT 8  ALKPHOS 56  BILITOT 1.5*  PROT 5.6*  ALBUMIN 2.7*   No results for input(s): LIPASE, AMYLASE in the last 168 hours. No results for input(s): AMMONIA in the last 168 hours. Coagulation profile No results for input(s): INR, PROTIME in the last 168 hours.  CBC: Recent Labs  Lab 03/14/22 1458  WBC 9.0  NEUTROABS 6.1  HGB 14.0  HCT 43.3  MCV 95.0  PLT 157   Cardiac Enzymes: No results for input(s): CKTOTAL, CKMB, CKMBINDEX, TROPONINI in the last 168 hours. BNP (last 3 results) No results for input(s): PROBNP in the last 8760 hours. CBG: No results for input(s): GLUCAP in the last 168 hours. D-Dimer: No results for input(s): DDIMER in the last 72 hours. Hgb A1c: No results for input(s): HGBA1C in the last 72 hours. Lipid Profile: No results for input(s): CHOL, HDL, LDLCALC, TRIG, CHOLHDL, LDLDIRECT in the last 72 hours. Thyroid function studies: Recent Labs    03/14/22 2319  TSH 1.231   Anemia work up: No results for input(s): VITAMINB12, FOLATE, FERRITIN, TIBC, IRON, RETICCTPCT in the last 72 hours. Sepsis Labs: Recent Labs  Lab 03/14/22 1458 03/14/22 2319  WBC 9.0  --   LATICACIDVEN  --  2.1*    Microbiology No results found for this or any previous visit (from the past 240 hour(s)).  Procedures and diagnostic studies:  DG Chest Portable 1 View  Result Date: 03/14/2022 CLINICAL DATA:  EMS called for wound check initially. EXAM: PORTABLE CHEST 1 VIEW COMPARISON:  Chest two views 03/18/2010 FINDINGS: Cardiac silhouette is at the upper limits of normal size for AP technique. Mediastinal contours are within normal limits. Mildly decreased lung volumes with bibasilar bronchovascular crowding. No focal airspace opacity to indicate  pneumonia. No definite pleural effusion. No pneumothorax is seen. Moderate dextrocurvature of the mid to lower thoracic spine. IMPRESSION: Decreased lung volumes without acute lung process seen. Electronically Signed   By: Charles Mcfarland M.D.   On: 03/14/2022 15:17               LOS: 2 days   Charles Mcfarland  [facility]   Pager on [email or web address]. If 7PM-7AM, please contact night-coverage at [email or web address]     03/16/2022, 2:46 PM

## 2022-03-16 NOTE — Progress Notes (Signed)
Civil engineer, contracting [facility])  Mr. Dripps is approved for hospice at home.  Spoke with his wife Elnita Maxwell, she is still inpatient at [facility]. She is not going to get to d/c home today, but is hopeful to d/c home Tuesday.   Confirmed no DME needed for Mr. Dinapoli, so once his wife has discharged, she will likely be ready for him to discharge as well.  [facility] will continue to follow and anticipate starting hospice services once Mr. Keatts is in the home.  Wallis Bamberg DNP, RN [facility] Liaison

## 2022-03-17 NOTE — Progress Notes (Signed)
Civil engineer, contracting [facility]) Hospital Liaison Note  MSW spoke with wife/Cheryl and she remains hospitalized at [facility]. Unfortunately, Ms. Erven has not been cleared medically to discharge at this time.   [facility] will continue to follow as patient awaits D/C while his caregiver/wife is hospitalized.  Please do not hesitate to call with any hospice related questions.    Thank you for the opportunity to participate in this patient's care.   Odette Fraction, MSW [facility] Liaison [phone]

## 2022-03-17 NOTE — Progress Notes (Signed)
Progress Note    Charles Mcfarland  [id number] DOB: [birth year]  DOA: 03/14/2022 PCP: Pcp, No      Brief Narrative:    Medical records reviewed and are as summarized below:  Charles Mcfarland is a 71 y.o. male with medical history significant for arthritis of the knees (declined surgery), bedridden status for over 2 years, dementia over 5 years that has slowly progressed over the past 2 years, history of remote hip replacement about 10 years ago, history of stroke about a year ago.  EMS was called to bring the patient to the hospital for evaluation of wound on his lower back.  However, EMS noted that patient was bradycardic with heart rate in the 30s.   He was found to have complete heart block.  However, his wife declined pacemaker placement and any aggressive treatment.  He said patient has poor quality of life and requested patient be placed on comfort measures.  He was evaluated by the hospice team was said he is eligible for comfort care at the hospice house.       Assessment/Plan:   Principal Problem:   Complete heart block (HCC) Active Problems:   Hospice care   Dementia without behavioral disturbance (HCC)   History of stroke   Decubitus ulcer of sacral region, stage 1   Body mass index is 33.91 kg/m.  (Obesity)   Complete heart block Progressively worsening dementia History of stroke Lower extremity edema Bedridden status Stage I sacral decubitus ulcer   PLAN  Continue comfort measures. His wife prefers that patient be discharged home with hospice.  However, his wife remains hospitalized and there is no safe discharge plan at this time.  Diet Order             Diet general           Diet regular Room service appropriate? Yes; Fluid consistency: Thin  Diet effective now                         Consultants: None  Procedures: None    Medications:    Continuous Infusions:   Anti-infectives (From admission, onward)    None               Family Communication/Anticipated D/C date and plan/Code Status   DVT prophylaxis:      Code Status: DNR  Family Communication: None Disposition Plan: Plan to discharge to hospice tomorrow   Status is: Inpatient Remains inpatient appropriate because: He is on comfort care, unsafe discharge since his wife is hospitalized.     Subjective:   Interval events noted.  He has no complaints.  He feels comfortable.  Objective:    Vitals:   03/15/22 0859 03/16/22 0755 03/16/22 1931 03/17/22 0833  BP: 138/61 (!) 176/66 (!) 153/61 (!) 144/68  Pulse: (!) 33 (!) 36 (!) 35 (!) 37  Resp: 16 15 16 20   Temp: 97.8 F (36.6 C) 97.7 F (36.5 C) 98.2 F (36.8 C) 97.8 F (36.6 C)  TempSrc:      SpO2: 97% 96% 97% 98%  Weight:      Height:       No data found.   Intake/Output Summary (Last 24 hours) at 03/17/2022 1707 Last data filed at 03/17/2022 0958 Gross per 24 hour  Intake 300 ml  Output 300 ml  Net 0 ml   Filed Weights   03/14/22 1457  Weight: 113.4 kg  Exam:  GEN: NAD SKIN: Warm and dry EYES: No pallor or icterus ENT: MMM CV: RRR PULM: CTA B ABD: soft, obese, NT, +BS CNS: AAO x 3, non focal EXT: Right upper extremity edema, bilateral lower extremity edema    Pressure Injury 03/14/22 Sacrum Stage 1 -  Intact skin with non-blanchable redness of a localized area usually over a bony prominence. (Active)  03/14/22 2230  Location: Sacrum  Location Orientation:   Staging: Stage 1 -  Intact skin with non-blanchable redness of a localized area usually over a bony prominence.  Wound Description (Comments):   Present on Admission: Yes  Dressing Type Foam - Lift dressing to assess site every shift 03/17/22 0900     Data Reviewed:   I have personally reviewed following labs and imaging studies:  Labs: Labs show the following:   Basic Metabolic Panel: Recent Labs  Lab 03/14/22 1458  NA 139  K 3.7  CL 109  CO2 23  GLUCOSE 73  BUN 13   CREATININE 0.68  CALCIUM 7.8*   GFR Estimated Creatinine Clearance: 113.3 mL/min (by C-G formula based on SCr of 0.68 mg/dL). Liver Function Tests: Recent Labs  Lab 03/14/22 1458  AST 18  ALT 8  ALKPHOS 56  BILITOT 1.5*  PROT 5.6*  ALBUMIN 2.7*   No results for input(s): LIPASE, AMYLASE in the last 168 hours. No results for input(s): AMMONIA in the last 168 hours. Coagulation profile No results for input(s): INR, PROTIME in the last 168 hours.  CBC: Recent Labs  Lab 03/14/22 1458  WBC 9.0  NEUTROABS 6.1  HGB 14.0  HCT 43.3  MCV 95.0  PLT 157   Cardiac Enzymes: No results for input(s): CKTOTAL, CKMB, CKMBINDEX, TROPONINI in the last 168 hours. BNP (last 3 results) No results for input(s): PROBNP in the last 8760 hours. CBG: No results for input(s): GLUCAP in the last 168 hours. D-Dimer: No results for input(s): DDIMER in the last 72 hours. Hgb A1c: No results for input(s): HGBA1C in the last 72 hours. Lipid Profile: No results for input(s): CHOL, HDL, LDLCALC, TRIG, CHOLHDL, LDLDIRECT in the last 72 hours. Thyroid function studies: Recent Labs    03/14/22 2319  TSH 1.231   Anemia work up: No results for input(s): VITAMINB12, FOLATE, FERRITIN, TIBC, IRON, RETICCTPCT in the last 72 hours. Sepsis Labs: Recent Labs  Lab 03/14/22 1458 03/14/22 2319  WBC 9.0  --   LATICACIDVEN  --  2.1*    Microbiology No results found for this or any previous visit (from the past 240 hour(s)).  Procedures and diagnostic studies:  No results found.             LOS: 3 days   Charles Mcfarland  [facility]   Pager on [email or web address]. If 7PM-7AM, please contact night-coverage at [email or web address]     03/17/2022, 5:07 PM

## 2022-03-18 MED ORDER — SODIUM CHLORIDE 0.9 % IV SOLN
12.5000 mg | Freq: Three times a day (TID) | INTRAVENOUS | Status: DC | PRN
  Administered 2022-03-18 – 2022-03-20 (×5): 12.5 mg via INTRAVENOUS
  Filled 2022-03-18 (×8): qty 0.5

## 2022-03-18 MED ORDER — GLYCOPYRROLATE 0.2 MG/ML IJ SOLN
0.1000 mg | Freq: Three times a day (TID) | INTRAMUSCULAR | Status: AC
  Administered 2022-03-18 – 2022-03-20 (×6): 0.1 mg via INTRAVENOUS
  Filled 2022-03-18 (×6): qty 1

## 2022-03-18 NOTE — Progress Notes (Signed)
Nutrition Brief Note  Chart reviewed. Pt now transitioning to comfort care.  No further nutrition interventions planned at this time.  Please re-consult as needed.   Kashae Carstens W, RD, LDN, CDCES Registered Dietitian II Certified Diabetes Care and Education Specialist Please refer to AMION for RD and/or RD on-call/weekend/after hours pager   

## 2022-03-18 NOTE — Progress Notes (Signed)
Progress Note    Charles Mcfarland  VZC:[id number] DOB: [birth year]  DOA: 03/14/2022 PCP: Pcp, No      Brief Narrative:    Medical records reviewed and are as summarized below:  Charles Mcfarland is a 71 y.o. male with medical history significant for arthritis of the knees (declined surgery), bedridden status for over 2 years, dementia over 5 years that has slowly progressed over the past 2 years, history of remote hip replacement about 10 years ago, history of stroke about a year ago.  EMS was called to bring the patient to the hospital for evaluation of wound on his lower back.  However, EMS noted that patient was bradycardic with heart rate in the 30s.   He was found to have complete heart block.  However, his wife declined pacemaker placement and any aggressive treatment.  He said patient has poor quality of life and requested patient be placed on comfort measures.       Assessment/Plan:   Principal Problem:   Complete heart block (HCC) Active Problems:   Hospice care   Dementia without behavioral disturbance (HCC)   History of stroke   Decubitus ulcer of sacral region, stage 1   Body mass index is 33.91 kg/m.  (Obesity)   Complete heart block Progressively worsening dementia History of stroke Lower extremity edema Bedridden status Stage I sacral decubitus ulcer   PLAN  Continue comfort measures IV Chlorpromazine as needed for hiccups His wife remains hospitalized at [facility] and there is no safe discharge plan for the patient at this time.   Diet Order             Diet general           Diet regular Room service appropriate? Yes; Fluid consistency: Thin  Diet effective now                         Consultants: None  Procedures: None    Medications:    Continuous Infusions:  chlorproMAZINE (THORAZINE) IV       Anti-infectives (From admission, onward)    None              Family Communication/Anticipated D/C  date and plan/Code Status   DVT prophylaxis:      Code Status: DNR  Family Communication: None Disposition Plan: Plan to discharge to hospice tomorrow   Status is: Inpatient Remains inpatient appropriate because: He is on comfort care, unsafe discharge since his wife is hospitalized.     Subjective:   Interval events noted.  Patient has been having hiccups.  Objective:    Vitals:   03/15/22 0859 03/16/22 0755 03/16/22 1931 03/17/22 0833  BP: 138/61 (!) 176/66 (!) 153/61 (!) 144/68  Pulse: (!) 33 (!) 36 (!) 35 (!) 37  Resp: 16 15 16 20   Temp: 97.8 F (36.6 C) 97.7 F (36.5 C) 98.2 F (36.8 C) 97.8 F (36.6 C)  TempSrc:      SpO2: 97% 96% 97% 98%  Weight:      Height:       No data found.  No intake or output data in the 24 hours ending 03/18/22 1236  Filed Weights   03/14/22 1457  Weight: 113.4 kg    Exam:  GEN: NAD SKIN: Warm and dry.  Erythematous rash in bilateral axillae and around the neck.  Stage I sacral decubitus ulcer. EYES: No pallor or icterus ENT: MMM CV: RRR  PULM: CTA B ABD: soft, obese, NT, +BS CNS: AAO x 3, non focal EXT: Right upper extremity edema, bilateral lower extremity edema      Pressure Injury 03/14/22 Sacrum Stage 1 -  Intact skin with non-blanchable redness of a localized area usually over a bony prominence. (Active)  03/14/22 2230  Location: Sacrum  Location Orientation:   Staging: Stage 1 -  Intact skin with non-blanchable redness of a localized area usually over a bony prominence.  Wound Description (Comments):   Present on Admission: Yes  Dressing Type Foam - Lift dressing to assess site every shift 03/18/22 6789     Data Reviewed:   I have personally reviewed following labs and imaging studies:  Labs: Labs show the following:   Basic Metabolic Panel: Recent Labs  Lab 03/14/22 1458  NA 139  K 3.7  CL 109  CO2 23  GLUCOSE 73  BUN 13  CREATININE 0.68  CALCIUM 7.8*   GFR Estimated Creatinine  Clearance: 113.3 mL/min (by C-G formula based on SCr of 0.68 mg/dL). Liver Function Tests: Recent Labs  Lab 03/14/22 1458  AST 18  ALT 8  ALKPHOS 56  BILITOT 1.5*  PROT 5.6*  ALBUMIN 2.7*   No results for input(s): LIPASE, AMYLASE in the last 168 hours. No results for input(s): AMMONIA in the last 168 hours. Coagulation profile No results for input(s): INR, PROTIME in the last 168 hours.  CBC: Recent Labs  Lab 03/14/22 1458  WBC 9.0  NEUTROABS 6.1  HGB 14.0  HCT 43.3  MCV 95.0  PLT 157   Cardiac Enzymes: No results for input(s): CKTOTAL, CKMB, CKMBINDEX, TROPONINI in the last 168 hours. BNP (last 3 results) No results for input(s): PROBNP in the last 8760 hours. CBG: No results for input(s): GLUCAP in the last 168 hours. D-Dimer: No results for input(s): DDIMER in the last 72 hours. Hgb A1c: No results for input(s): HGBA1C in the last 72 hours. Lipid Profile: No results for input(s): CHOL, HDL, LDLCALC, TRIG, CHOLHDL, LDLDIRECT in the last 72 hours. Thyroid function studies: No results for input(s): TSH, T4TOTAL, T3FREE, THYROIDAB in the last 72 hours.  Invalid input(s): FREET3  Anemia work up: No results for input(s): VITAMINB12, FOLATE, FERRITIN, TIBC, IRON, RETICCTPCT in the last 72 hours. Sepsis Labs: Recent Labs  Lab 03/14/22 1458 03/14/22 2319  WBC 9.0  --   LATICACIDVEN  --  2.1*    Microbiology No results found for this or any previous visit (from the past 240 hour(s)).  Procedures and diagnostic studies:  No results found.             LOS: 4 days   Latravion Graves  [facility]   Pager on [email or web address]. If 7PM-7AM, please contact night-coverage at [email or web address]     03/18/2022, 12:36 PM

## 2022-03-18 NOTE — TOC Progression Note (Signed)
Transition of Care [facility]) - Progression Note    Patient Details  Name: Charles Mcfarland MRN: [id number] Date of Birth: 1951-02-27  Transition of Care [facility]) CM/SW Contact  Caryn Section, RN Phone Number: 03/18/2022, 10:51 AM  Clinical Narrative:   Patient wife still in hospital.  As per [facility], no discharge today, care team aware.    Expected Discharge Plan: Hospice Medical Facility Barriers to Discharge: Hospice Bed not available  Expected Discharge Plan and Services Expected Discharge Plan: Hospice Medical Facility         Expected Discharge Date: 03/15/22                                     Social Determinants of Health (SDOH) Interventions    Readmission Risk Interventions     View : No data to display.

## 2022-03-18 NOTE — Care Management Important Message (Signed)
Important Message  Patient Details  Name: Charles Mcfarland MRN: [id number] Date of Birth: [birth year]   Medicare Important Message Given:  Other (see comment)  Patient is on comfort care and will discharge home with Hospice. Out of respect for the patient and family no Important Message from [facility] given.  [name] 03/18/2022, 8:43 AM

## 2022-03-18 NOTE — Progress Notes (Signed)
Civil engineer, contracting [facility]) Hospital Liaison Note  Wife/Cheryl remains hospitalized at [facility]. Unfortunately, Ms. Grandison has not been cleared medically to discharge at this time as patient's caregiver is seeking medical care.    ACC will continue to follow as patient awaits D/C while his caregiver/wife is hospitalized.   Please do not hesitate to call with any hospice related questions.    Thank you for the opportunity to participate in this patient's care.   Odette Fraction, MSW [facility] Liaison [phone]

## 2022-03-18 NOTE — Plan of Care (Signed)
Pt with on going hiccups. MD notified orders placed. Awaiting medication from pharmacy. Will administer with receipt. Continuing to monitor.

## 2022-03-19 MED ORDER — ORAL CARE MOUTH RINSE
15.0000 mL | Freq: Two times a day (BID) | OROMUCOSAL | Status: DC
  Administered 2022-03-20 – 2022-03-22 (×4): 15 mL via OROMUCOSAL

## 2022-03-19 MED ORDER — CHLORHEXIDINE GLUCONATE 0.12 % MT SOLN
15.0000 mL | Freq: Two times a day (BID) | OROMUCOSAL | Status: DC
  Administered 2022-03-19 – 2022-03-22 (×5): 15 mL via OROMUCOSAL
  Filled 2022-03-19 (×5): qty 15

## 2022-03-19 NOTE — Progress Notes (Signed)
Charles Mcfarland's son, Charles Mcfarland, visited and reported Charles Mcfarland had surgery and was discharged from the hospital this afternoon. The plan is for multiple family members to assist in caring for Charles Mcfarland. Charles Mcfarland that we will be in touch Thursday to discuss plan for discharge. 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

## 2022-03-19 NOTE — Progress Notes (Signed)
  PROGRESS NOTE  LOTUS GOVER WER:[id number] DOB: 08/25/1951 DOA: 03/14/2022 PCP: Oneita Hurt, No  Brief History   Charles Mcfarland is a 71 y.o. male with medical history significant for arthritis of the knees (declined surgery), bedridden status for over 2 years, dementia over 5 years that has slowly progressed over the past 2 years, history of remote hip replacement about 10 years ago, history of stroke about a year ago.  EMS was called to bring the patient to the hospital for evaluation of wound on his lower back.  However, EMS noted that patient was bradycardic with heart rate in the 30s.   He was found to have complete heart block.  However, his wife declined pacemaker placement and any aggressive treatment.  He said patient has poor quality of life and requested patient be placed on comfort measures.   As the patient's spouse is an inpatient at [facility] in [place], discharge of the patient to home will not be until Saturday am.  Consultants  None  Procedures  None  Antibiotics   Anti-infectives (From admission, onward)    None      Subjective  The patient is resting comfortably. He has the hiccoughs. No new complaints.   Objective   Vitals:  Vitals:   03/17/22 0833 03/19/22 0820  BP: (!) 144/68 123/79  Pulse: (!) 37 (!) 40  Resp: 20 17  Temp: 97.8 F (36.6 C) 97.9 F (36.6 C)  SpO2: 98% 100%    Exam:  Constitutional:  Appears calm and comfortable Respiratory:  CTA bilaterally, no w/r/r.  Respiratory effort normal. 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  Non-ambulatory Skin:  No rashes, lesions, ulcers palpation of skin: no induration or  nodules  I have personally reviewed the following:   Today's Data  Vitals  Lab Data    Micro Data    Imaging  CXR  Cardiology Data  EKG  Other Data    Scheduled Meds:  chlorhexidine  15 mL Mouth Rinse BID   glycopyrrolate  0.1 mg Intravenous TID   mouth rinse  15 mL Mouth Rinse q12n4p   Continuous Infusions:  chlorproMAZINE (THORAZINE) IV Stopped (03/19/22 1443)    Principal Problem:   Complete heart block (HCC) Active Problems:   Hospice care   Dementia without behavioral disturbance (HCC)   History of stroke   Decubitus ulcer of sacral region, stage 1   LOS: 5 days   A & P  Complete heart block Progressively worsening dementia History of stroke Lower extremity edema Bedridden status Stage I sacral decubitus ulcer    PLAN   Continue comfort measures IV Chlorpromazine as needed for hiccups His wife remains hospitalized at [facility] and there is no safe discharge plan for the patient at this time.  I have seen and examined this patient myself. I have spent 34 minutes in his evaluation and care.  DVT prophylaxis: Comfort care Code Status: DNR Family Communication: None available Disposition Plan: Home with hospice    Nayshawn Mesta, DO [facility] Direct contact: see [email or web address]  7PM-7AM contact night coverage as above 03/19/2022, 3:31 PM  LOS: 5 days

## 2022-03-19 NOTE — Congregational Nurse Program (Signed)
Civil engineer, contracting [facility]) Hospital Liaison Note   Wife/Cheryl informed RN/Maggie that she will be prepared for patient to return home Saturday once she settles from [facility] hospitalization.   ACC will continue to follow as patient awaits D/C while his caregiver/wife is hospitalized.   Please do not hesitate to call with any hospice related questions.    Thank you for the opportunity to participate in this patient's care.   Odette Fraction, MSW [facility] Liaison [phone]

## 2022-03-19 NOTE — Progress Notes (Signed)
Updated pt spouse, Elnita Maxwell, on pt status.  Pt resting comfortably.  Elnita Maxwell requested for pt to discharge home on Saturday due to Cheryl's recent hospitalization.  Will continue to monitor pt.

## 2022-03-19 NOTE — Care Management Important Message (Signed)
Important Message  Patient Details  Name: Charles Mcfarland MRN: [id number] Date of Birth: Sep 01, 1951   Medicare Important Message Given:  Other (see comment)  Pateint is on comfort care now and will discharge home with Hospice. Out of respect for the patient and family no Important Message from [facility] given.   Olegario Messier A Hensley Treat 03/19/2022, 8:53 AM

## 2022-03-20 NOTE — Progress Notes (Signed)
  PROGRESS NOTE  Charles Mcfarland HGD:[id number] DOB: 03-29-1951 DOA: 03/14/2022 PCP: Oneita Hurt, No  Brief History   Charles Mcfarland is a 71 y.o. male with medical history significant for arthritis of the knees (declined surgery), bedridden status for over 2 years, dementia over 5 years that has slowly progressed over the past 2 years, history of remote hip replacement about 10 years ago, history of stroke about a year ago.  EMS was called to bring the patient to the hospital for evaluation of wound on his lower back.  However, EMS noted that patient was bradycardic with heart rate in the 30s.   He was found to have complete heart block.  However, his wife declined pacemaker placement and any aggressive treatment.  He said patient has poor quality of life and requested patient be placed on comfort measures.   As the patient's spouse is an inpatient at [facility] in [place], discharge of the patient to home will not be until Saturday am.  Consultants  None  Procedures  None  Antibiotics   Anti-infectives (From admission, onward)    None      Subjective  The patient is resting comfortably. He has the hiccoughs. No new complaints.   Objective   Vitals:  Vitals:   03/17/22 0833 03/19/22 0820  BP:  123/79  Pulse: (!) 37 (!) 40  Resp: 20 17  Temp: 97.8 F (36.6 C) 97.9 F (36.6 C)  SpO2: 98% 100%    Exam:  Constitutional:  Appears calm and comfortable Respiratory:  CTA bilaterally, no w/r/r.  Respiratory effort normal. 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  Non-ambulatory Skin:  No rashes, lesions, ulcers palpation of skin: no induration or nodules  I  have personally reviewed the following:   Today's Data  Vitals  Lab Data    Micro Data    Imaging  CXR  Cardiology Data  EKG  Other Data    Scheduled Meds:  chlorhexidine  15 mL Mouth Rinse BID   mouth rinse  15 mL Mouth Rinse q12n4p   Continuous Infusions:  chlorproMAZINE (THORAZINE) IV 12.5 mg (03/20/22 1016)    Principal Problem:   Complete heart block (HCC) Active Problems:   Hospice care   Dementia without behavioral disturbance (HCC)   History of stroke   Decubitus ulcer of sacral region, stage 1   LOS: 6 days   A & P  Complete heart block Progressively worsening dementia History of stroke Lower extremity edema Bedridden status Stage I sacral decubitus ulcer    PLAN   Continue comfort measures IV Chlorpromazine as needed for hiccups His wife remains hospitalized at [facility] and there is no safe discharge plan for the patient at this time.  I have seen and examined this patient myself. I have spent 34 minutes in his evaluation and care.  DVT prophylaxis: Comfort care Code Status: DNR Family Communication: None available Disposition Plan: Home with hospice    Timoteo Carreiro, DO [facility] Direct contact: see [email or web address]  7PM-7AM contact night coverage as above 03/20/2022, 1:39 PM  LOS: 5 days

## 2022-03-20 NOTE — Progress Notes (Signed)
Civil engineer, contracting [facility]) Hospital Liaison Note   MSW spoke with wife/Cheryl and she confirms that plan remains for patient to discharge on Saturday, 6.3. Elnita Maxwell confirmed that there are no DME needs at this time and that she is prepared for patient return tomorrow.  Please do not hesitate to call with any hospice related questions.    Thank you for the opportunity to participate in this patient's care.   Odette Fraction, MSW [facility] Liaison [phone]

## 2022-03-20 NOTE — TOC Progression Note (Signed)
Transition of Care [facility]) - Progression Note    Patient Details  Name: Charles Mcfarland MRN: [id number] Date of Birth: 26-Oct-1950  Transition [facility]) CM/SW Contact  Caryn Section, RN Phone Number: 03/20/2022, 11:10 AM  Clinical Narrative:   Anticipated dc home 06/03 patient will be in care of wife and hospice Shania at [facility] aware.    Expected Discharge Plan: Hospice Medical Facility Barriers to Discharge: Hospice Bed not available  Expected Discharge Plan and Services Expected Discharge Plan: Hospice Medical Facility         Expected Discharge Date: 03/15/22                                     Social Determinants of Health (SDOH) Interventions    Readmission Risk Interventions     View : No data to display.

## 2022-03-21 MED ORDER — CHLORHEXIDINE GLUCONATE 0.12 % MT SOLN: 15.0000 mL | Freq: Two times a day (BID) | OROMUCOSAL | 0 refills | Status: AC

## 2022-03-21 MED ORDER — ORAL CARE MOUTH RINSE
15.0000 mL | Freq: Two times a day (BID) | OROMUCOSAL | 0 refills | Status: AC
Start: 2022-03-21 — End: ?

## 2022-03-21 NOTE — TOC Transition Note (Addendum)
Transition of Care [facility]) - CM/SW Discharge Note   Patient Details  Name: Charles Mcfarland MRN: [id number] Date of Birth: Dec 16, 1950  Transition of Care [facility]) CM/SW Contact:  Bing Quarry, RN Phone Number: 03/21/2022, 11:10 AM   Clinical Narrative:  Patient to be discharged to home health/hospice. Per hospice liaison, everything is set up and they are ready to accept patient and will see patient tomorrow. Gabriel Cirri RN CM    Patient requires EMS transport to home. Spouse notified and will be present in the home. Gabriel Cirri RN CM   Final next level of care: Home w Hospice Care Barriers to Discharge: Barriers Resolved   Patient Goals and CMS Choice Patient states their goals for this hospitalization and ongoing recovery are:: hospice home      Discharge Placement                    Patient and family notified of of transfer: 03/21/22  Discharge Plan and Services                DME Arranged: N/A DME Agency: NA       HH Arranged: NA HH Agency: Hospice of Museum/gallery exhibitions officer spoke with at [facility] Agency: Thea Gist RN  Social Determinants of Health (SDOH) Interventions     Readmission Risk Interventions     View : No data to display.

## 2022-03-21 NOTE — Discharge Summary (Deleted)
Physician Discharge Summary   Patient: Charles Mcfarland MRN: [id number] DOB: 1951-05-30  Admit date:     03/14/2022  Discharge date: 03/21/22  Discharge Physician: Fran Lowes   PCP: Pcp, No   Recommendations at discharge:   Follow up with hospice team within 24 hours of discharge.   Discharge Diagnoses: Principal Problem:   Complete heart block (HCC) Active Problems:   Hospice care   Dementia without behavioral disturbance (HCC)   History of stroke   Decubitus ulcer of sacral region, stage 1  Resolved Problems:   * No resolved hospital problems. [facility] Course: Mr. Charles Mcfarland is a 71 y.o. male with medical history significant for arthritis of the knees (declined surgery), bedridden status for over 2 years, dementia over 5 years that has slowly progressed over the past 2 years, history of remote hip replacement about 10 years ago, history of stroke about a year ago.  EMS was called to bring the patient to the hospital for evaluation of wound on his lower back.  However, EMS noted that patient was bradycardic with heart rate in the 30s.  He was found to have complete heart block.  However, his wife declined pacemaker placement and any aggressive treatment.  He said patient has poor quality of life and requested patient be placed on comfort measures.  He was evaluated by the hospice team was said he is eligible for comfort care at the hospice house.   Consultants: None Procedures performed:  None Disposition: Hospice care Diet recommendation:  Discharge Diet Orders (From admission, onward)     Start     Ordered   03/15/22 0000  Diet general        03/15/22 1324           Regular diet DISCHARGE MEDICATION: Allergies as of 03/21/2022   No Known Allergies      Medication List     TAKE these medications    chlorhexidine 0.12 % solution Commonly known as: PERIDEX 15 mLs by Mouth Rinse route 2 (two) times daily.   mouth rinse Liqd solution 15 mLs by Mouth Rinse  route 2 times daily at 12 noon and 4 pm.        Discharge Exam: Filed Weights   03/14/22 1457  Weight: 113.4 kg   GEN: NAD SKIN: Erythematous rash in bilateral axillae and around the neck EYES: EOMI ENT: MMM CV: RRR, bradycardic PULM: CTA B ABD: soft, obese, NT, +BS CNS: AAO x person only, non focal EXT: Bilateral leg edema, no tenderness   Condition at discharge: stable  The results of significant diagnostics from this hospitalization (including imaging, microbiology, ancillary and laboratory) are listed below for reference.   Imaging Studies: DG Chest Portable 1 View  Result Date: 03/14/2022 CLINICAL DATA:  EMS called for wound check initially. EXAM: PORTABLE CHEST 1 VIEW COMPARISON:  Chest two views 03/18/2010 FINDINGS: Cardiac silhouette is at the upper limits of normal size for AP technique. Mediastinal contours are within normal limits. Mildly decreased lung volumes with bibasilar bronchovascular crowding. No focal airspace opacity to indicate pneumonia. No definite pleural effusion. No pneumothorax is seen. Moderate dextrocurvature of the mid to lower thoracic spine. IMPRESSION: Decreased lung volumes without acute lung process seen. Electronically Signed   By: Neita Garnet M.D.   On: 03/14/2022 15:17    Microbiology: No results found for this or any previous visit.  Labs: CBC: Recent Labs  Lab 03/14/22 1458  WBC 9.0  NEUTROABS 6.1  HGB 14.0  HCT 43.3  MCV 95.0  PLT 157    Basic Metabolic Panel: Recent Labs  Lab 03/14/22 1458  NA 139  K 3.7  CL 109  CO2 23  GLUCOSE 73  BUN 13  CREATININE 0.68  CALCIUM 7.8*    Liver Function Tests: Recent Labs  Lab 03/14/22 1458  AST 18  ALT 8  ALKPHOS 56  BILITOT 1.5*  PROT 5.6*  ALBUMIN 2.7*    CBG: No results for input(s): GLUCAP in the last 168 hours.  Discharge time spent: greater than 30 minutes.  Signed: Andjela Wickes, DO [facility] 03/21/2022

## 2022-04-18 NOTE — Progress Notes (Signed)
  PROGRESS NOTE  Charles Mcfarland MEB:[id number] DOB: [birth year] DOA: 03/14/2022 PCP: Oneita Hurt, No  Brief History   Charles Mcfarland is a 71 y.o. male with medical history significant for arthritis of the knees (declined surgery), bedridden status for over 2 years, dementia over 5 years that has slowly progressed over the past 2 years, history of remote hip replacement about 10 years ago, history of stroke about a year ago.  EMS was called to bring the patient to the hospital for evaluation of wound on his lower back.  However, EMS noted that patient was bradycardic with heart rate in the 30s.   He was found to have complete heart block.  However, his wife declined pacemaker placement and any aggressive treatment.  He said patient has poor quality of life and requested patient be placed on comfort measures.   As the patient's spouse is an inpatient at [facility] in [place], discharge of the patient to home will not be until Saturday am.  Consultants  None  Procedures  None  Antibiotics   Anti-infectives (From admission, onward)    None      Subjective  The patient is resting comfortably. He has the hiccoughs. No new complaints.   Objective   Vitals:  Vitals:   03/19/22 0820 03/21/22 0736  BP:  128/67  Pulse: (!) 40 (!) 40  Resp: 17 20  Temp: 97.9 F (36.6 C) 98.7 F (37.1 C)  SpO2: 100% 94%    Exam:  Constitutional:  Appears calm and comfortable Respiratory:  CTA bilaterally, no w/r/r.  Respiratory effort normal. 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  Non-ambulatory Skin:  No rashes, lesions, ulcers palpation of skin: no induration or nodules  I  have personally reviewed the following:   Today's Data  Vitals  Lab Data    Micro Data    Imaging  CXR  Cardiology Data  EKG  Other Data    Scheduled Meds:  chlorhexidine  15 mL Mouth Rinse BID   mouth rinse  15 mL Mouth Rinse q12n4p   Continuous Infusions:  chlorproMAZINE (THORAZINE) IV 12.5 mg (03/20/22 1016)    Principal Problem:   Complete heart block (HCC) Active Problems:   Hospice care   Dementia without behavioral disturbance (HCC)   History of stroke   Decubitus ulcer of sacral region, stage 1   LOS: 8 days   A & P  Complete heart block Progressively worsening dementia History of stroke Lower extremity edema Bedridden status Stage I sacral decubitus ulcer    PLAN   Continue comfort measures IV Chlorpromazine as needed for hiccups His wife remains hospitalized at [facility] and there is no safe discharge plan for the patient at this time.  I have seen and examined this patient myself. I have spent 34 minutes in his evaluation and care.  DVT prophylaxis: Comfort care Code Status: DNR Family Communication: None available Disposition Plan: Home with hospice    Raji Glinski, DO [facility] Direct contact: see [email or web address]  7PM-7AM contact night coverage as above 03/21/2022, 1:39 PM  LOS: 5 days

## 2022-04-18 NOTE — Discharge Summary (Signed)
Physician Discharge Summary   Patient: Charles Mcfarland MRN: [id number] DOB: [birth year]  Admit date:     03/14/2022  Discharge date: 2022/04/12  Discharge Physician: Fran Lowes   PCP: Pcp, No   Recommendations at discharge:   Follow up with hospice team within 24 hours of discharge.   Discharge Diagnoses: Principal Problem:   Complete heart block (HCC) Active Problems:   Hospice care   Dementia without behavioral disturbance (HCC)   History of stroke   Decubitus ulcer of sacral region, stage 1  Resolved Problems:   * No resolved hospital problems. [place] [facility]: Charles Mcfarland is a 71 y.o. male with medical history significant for arthritis of the knees (declined surgery), bedridden status for over 2 years, dementia over 5 years that has slowly progressed over the past 2 years, history of remote hip replacement about 10 years ago, history of stroke about a year ago.  EMS was called to bring the patient to the hospital for evaluation of wound on his lower back.  However, EMS noted that patient was bradycardic with heart rate in the 30s.  He was found to have complete heart block.  However, his wife declined pacemaker placement and any aggressive treatment.  He said patient has poor quality of life and requested patient be placed on comfort measures.  He was evaluated by the hospice team was said he is eligible for comfort care at the hospice house.   Consultants: None Procedures performed:  None Disposition: Hospice care Diet recommendation:  Discharge Diet Orders (From admission, onward)     Start     Ordered   03/15/22 0000  Diet general        03/15/22 1324           Regular diet DISCHARGE MEDICATION: Allergies as of 04/12/22   No Known Allergies      Medication List     TAKE these medications    chlorhexidine 0.12 % solution Commonly known as: PERIDEX 15 mLs by Mouth Rinse route 2 (two) times daily.   mouth rinse Liqd solution 15 mLs by Mouth Rinse  route 2 times daily at 12 noon and 4 pm.        Discharge Exam: Filed Weights   03/14/22 1457  Weight: 113.4 kg   GEN: NAD SKIN: Erythematous rash in bilateral axillae and around the neck EYES: EOMI ENT: MMM CV: RRR, bradycardic PULM: CTA B ABD: soft, obese, NT, +BS CNS: AAO x person only, non focal EXT: Bilateral leg edema, no tenderness   Condition at discharge: stable  The results of significant diagnostics from this hospitalization (including imaging, microbiology, ancillary and laboratory) are listed below for reference.   Imaging Studies: DG Chest Portable 1 View  Result Date: 03/14/2022 CLINICAL DATA:  EMS called for wound check initially. EXAM: PORTABLE CHEST 1 VIEW COMPARISON:  Chest two views 03/18/2010 FINDINGS: Cardiac silhouette is at the upper limits of normal size for AP technique. Mediastinal contours are within normal limits. Mildly decreased lung volumes with bibasilar bronchovascular crowding. No focal airspace opacity to indicate pneumonia. No definite pleural effusion. No pneumothorax is seen. Moderate dextrocurvature of the mid to lower thoracic spine. IMPRESSION: Decreased lung volumes without acute lung process seen. Electronically Signed   By: Neita Garnet M.D.   On: 03/14/2022 15:17    Microbiology: No results found for this or any previous visit.  Labs: CBC: No results for input(s): WBC, NEUTROABS, HGB, HCT, MCV, PLT in the last  168 hours.  Basic Metabolic Panel: No results for input(s): NA, K, CL, CO2, GLUCOSE, BUN, CREATININE, CALCIUM, MG, PHOS in the last 168 hours.  Liver Function Tests: No results for input(s): AST, ALT, ALKPHOS, BILITOT, PROT, ALBUMIN in the last 168 hours.  CBG: No results for input(s): GLUCAP in the last 168 hours.  Discharge time spent: greater than 30 minutes.  Signed: Lerone Onder, DO [facility] 2022/04/03

## 2022-04-18 NOTE — TOC Transition Note (Signed)
Transition of Care [facility]) - CM/SW Discharge Note   Patient Details  Name: ROSALIE GELPI MRN: [id number] Date of Birth: Jun 29, 1951  [facility] CM/SW Contact:  Bing Quarry, RN Phone Number: 03/26/22, 9:57 AM   Clinical Narrative:  Patient discharging today, discharge was unable to be completed yesterday due to EMS availability issues. Spouse will be at home to received patient. Hospice HH will see patient tomorrow, if not today. Gabriel Cirri RN CM      Final next level of care: Home w Hospice Care Barriers to Discharge: Barriers Resolved   Patient Goals and CMS Choice Patient states their goals for this hospitalization and ongoing recovery are:: hospice home      Discharge Placement                  Name of family member notified: Dudley Mages (SPouse) Patient and family notified of of transfer: 03/21/22  Discharge Plan and Services                DME Arranged: N/A DME Agency: NA       HH Arranged: NA HH Agency: [facility] officer spoke with at [facility] Agency: Thea Gist RN  Social Determinants of Health (SDOH) Interventions     Readmission Risk Interventions     View : No data to display.

## 2022-04-18 NOTE — TOC Progression Note (Signed)
Transition of Care [facility]) - Progression Note    Patient Details  Name: NEKTARIOS PENNINGER MRN: [id number] Date of Birth: 26-Jul-1951  [facility]) CM/SW Contact  Izola Price, RN Phone Number: 04-01-22, 9:54 AM  Clinical Narrative:   Patient did not/was not able to be discharged to home with hospice. EMS was set up and confirmed, but never arrived as far as can tell. Will discharge today. Updated forms. Unit RN to call [facility]. Simmie Davies RN CM    Expected Discharge Plan: Hospice Medical Facility Barriers to Discharge: Barriers Resolved  Expected Discharge Plan and Services Expected Discharge Plan: [place]         Expected Discharge Date: 04/01/22               DME Arranged: N/A DME Agency: NA       HH Arranged: NA HH Agency: [facility]     Representative spoke with at [place]: [place]   Social Determinants of Health (SDOH) Interventions    Readmission Risk Interventions     View : No data to display.

## 2022-04-18 DEATH — deceased

## 2023-12-29 IMAGING — DX DG CHEST 1V PORT
1 series · 1 of 1 positions shown · non-contrast
Comparison: Chest two views 03/18/2010

CLINICAL DATA: EMS called for wound check initially.

EXAM:
PORTABLE CHEST 1 VIEW

[chest ap]
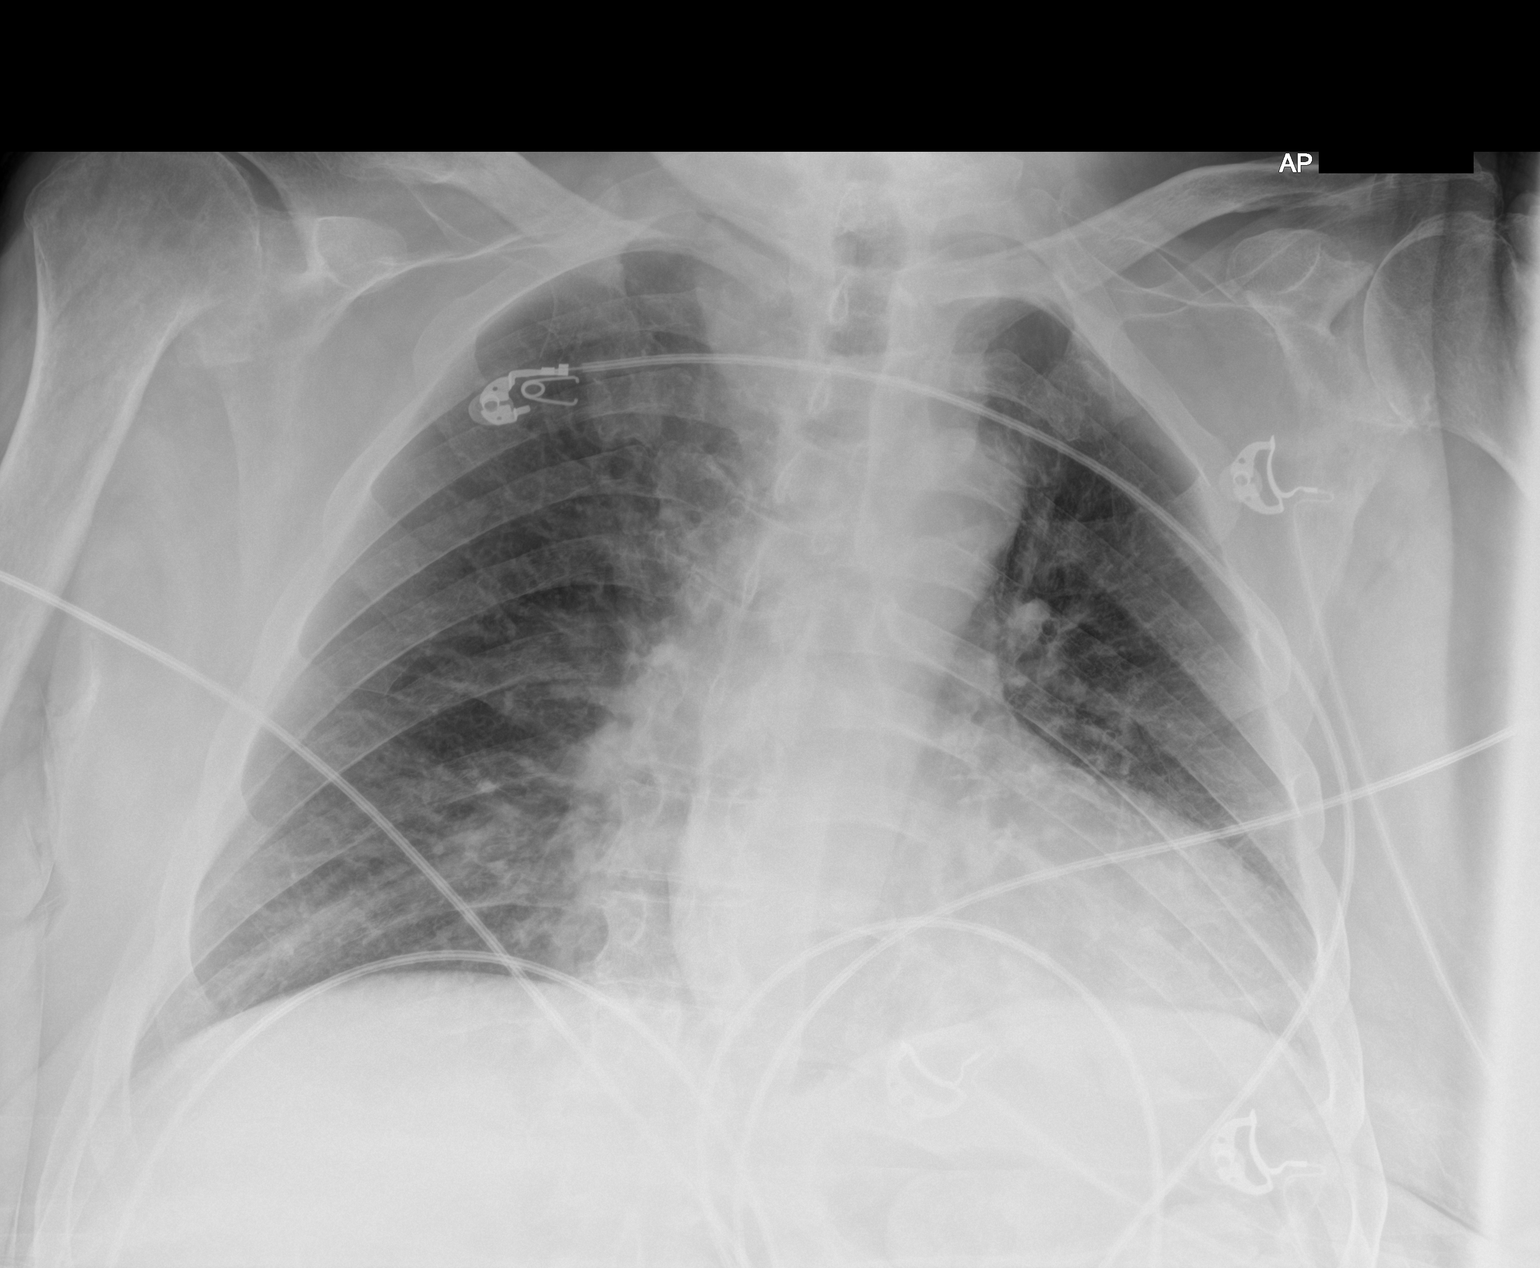

[1 of 1 positions shown; findings below may reference images not displayed]

FINDINGS: Cardiac silhouette is at the upper limits of normal size for AP
technique. Mediastinal contours are within normal limits. Mildly
decreased lung volumes with bibasilar bronchovascular crowding. No
focal airspace opacity to indicate pneumonia. No definite pleural
effusion. No pneumothorax is seen. Moderate dextrocurvature of the
mid to lower thoracic spine.
IMPRESSION: Decreased lung volumes without acute lung process seen.
